# Patient Record
Sex: Male | Born: 1995 | Race: White | Hispanic: No | Marital: Single | Smoking: Never smoker
Health system: Southern US, Community
[De-identification: ages and names within clinical notes are randomized; demographics above are authoritative.]

## PROBLEM LIST (undated history)

## (undated) DIAGNOSIS — B9681 Helicobacter pylori [H. pylori] as the cause of diseases classified elsewhere: Secondary | ICD-10-CM

## (undated) DIAGNOSIS — R7881 Bacteremia: Secondary | ICD-10-CM

## (undated) DIAGNOSIS — K589 Irritable bowel syndrome without diarrhea: Secondary | ICD-10-CM

## (undated) DIAGNOSIS — K900 Celiac disease: Secondary | ICD-10-CM

## (undated) DIAGNOSIS — Z8781 Personal history of (healed) traumatic fracture: Secondary | ICD-10-CM

## (undated) HISTORY — DX: Celiac disease: K90.0

## (undated) HISTORY — DX: Irritable bowel syndrome without diarrhea: K58.9

## (undated) HISTORY — PX: NOSE SURGERY: SHX723

---
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
## 2017-01-29 ENCOUNTER — Ambulatory Visit: Payer: Medicaid Other | Admitting: Internal Medicine

## 2017-03-07 ENCOUNTER — Ambulatory Visit: Payer: Medicaid Other | Admitting: Internal Medicine

## 2017-05-03 ENCOUNTER — Telehealth: Payer: Self-pay | Admitting: *Deleted

## 2017-05-03 NOTE — Telephone Encounter (Signed)
Called and spoke to Danny Khan, mother of the patient.  I advised we had records from [name] but after 9 +  months, we no longer have them.  She said she could get them for me. I advised her to have them faxed to us , to [phone] attention Hyacinth MeekerJennifer Lemmon PA's attention. I did remind Danny Khan of the appointment Tueseday, 05-08-2017.

## 2017-05-07 ENCOUNTER — Encounter: Payer: Self-pay | Admitting: Gastroenterology

## 2017-05-08 ENCOUNTER — Ambulatory Visit: Payer: Self-pay | Admitting: Physician Assistant

## 2017-05-11 ENCOUNTER — Encounter: Payer: Self-pay | Admitting: Gastroenterology

## 2017-05-11 ENCOUNTER — Ambulatory Visit (INDEPENDENT_AMBULATORY_CARE_PROVIDER_SITE_OTHER): Payer: BLUE CROSS/BLUE SHIELD | Admitting: Gastroenterology

## 2017-05-11 ENCOUNTER — Encounter (INDEPENDENT_AMBULATORY_CARE_PROVIDER_SITE_OTHER): Payer: Self-pay

## 2017-05-11 ENCOUNTER — Other Ambulatory Visit (INDEPENDENT_AMBULATORY_CARE_PROVIDER_SITE_OTHER): Payer: BLUE CROSS/BLUE SHIELD

## 2017-05-11 VITALS — BP 120/70 | HR 78 | Ht 72.0 in | Wt 212.0 lb

## 2017-05-11 DIAGNOSIS — F411 Generalized anxiety disorder: Secondary | ICD-10-CM

## 2017-05-11 DIAGNOSIS — R1084 Generalized abdominal pain: Secondary | ICD-10-CM | POA: Diagnosis not present

## 2017-05-11 DIAGNOSIS — R197 Diarrhea, unspecified: Secondary | ICD-10-CM | POA: Diagnosis not present

## 2017-05-11 LAB — CBC WITH DIFFERENTIAL/PLATELET
BASOS PCT: 0.5 % (ref 0.0–3.0)
Basophils Absolute: 0 10*3/uL (ref 0.0–0.1)
EOS PCT: 3.4 % (ref 0.0–5.0)
Eosinophils Absolute: 0.2 10*3/uL (ref 0.0–0.7)
HEMATOCRIT: 46.8 % (ref 36.0–49.0)
Hemoglobin: 16.4 g/dL — ABNORMAL HIGH (ref 12.0–16.0)
LYMPHS PCT: 20.7 % — AB (ref 24.0–48.0)
Lymphs Abs: 1.3 10*3/uL (ref 0.7–4.0)
MCHC: 35.1 g/dL (ref 31.0–37.0)
MCV: 88.4 fl (ref 78.0–98.0)
MONOS PCT: 9.4 % (ref 3.0–12.0)
Monocytes Absolute: 0.6 10*3/uL (ref 0.1–1.0)
NEUTROS ABS: 4.3 10*3/uL (ref 1.4–7.7)
Neutrophils Relative %: 66 % (ref 43.0–71.0)
PLATELETS: 206 10*3/uL (ref 150.0–575.0)
RBC: 5.29 Mil/uL (ref 3.80–5.70)
RDW: 12.5 % (ref 11.4–15.5)
WBC: 6.5 10*3/uL (ref 4.5–13.5)

## 2017-05-11 LAB — COMPREHENSIVE METABOLIC PANEL
ALT: 16 U/L (ref 0–53)
AST: 16 U/L (ref 0–37)
Albumin: 4.7 g/dL (ref 3.5–5.2)
Alkaline Phosphatase: 100 U/L (ref 52–171)
BUN: 14 mg/dL (ref 6–23)
CHLORIDE: 105 meq/L (ref 96–112)
CO2: 28 meq/L (ref 19–32)
Calcium: 9.7 mg/dL (ref 8.4–10.5)
Creatinine, Ser: 1.08 mg/dL (ref 0.40–1.50)
GFR: 93.25 mL/min (ref >60.00)
Glucose, Bld: 94 mg/dL (ref 70–99)
Potassium: 3.9 mEq/L (ref 3.5–5.1)
Sodium: 142 mEq/L (ref 135–145)
Total Bilirubin: 0.6 mg/dL (ref 0.2–1.2)
Total Protein: 7.3 g/dL (ref 6.0–8.3)

## 2017-05-11 LAB — IGA: IgA: 220 mg/dL (ref 68–378)

## 2017-05-11 LAB — TSH: TSH: 1.14 u[IU]/mL (ref 0.40–5.00)

## 2017-05-11 LAB — SEDIMENTATION RATE: SED RATE: 4 mm/h (ref 0–15)

## 2017-05-11 LAB — C-REACTIVE PROTEIN: CRP: 0.4 mg/dL — AB (ref 0.5–20.0)

## 2017-05-11 MED ORDER — DICYCLOMINE HCL 20 MG PO TABS: 20.0000 mg | ORAL_TABLET | Freq: Two times a day (BID) | ORAL | 3 refills | Status: DC

## 2017-05-11 MED ORDER — NA SULFATE-K SULFATE-MG SULF 17.5-3.13-1.6 GM/177ML PO SOLN: 1.0000 | ORAL | 0 refills | Status: DC

## 2017-05-11 NOTE — Patient Instructions (Addendum)
You may have a light breakfast the morning of prep day (the day before the procedure).  You may choose from one of the following items: eggs and toast OR chicken noodle soup and crackers.  You should have your breakfast completed between 8:00 and 9:00 am the day before your procedure.   After you have had your light breakfast you should start a clear liquid diet only, NO SOLIDS. No additional solid food is allowed. You may continue to have clear liquid up to 3 hours prior to your procedure.   Your physician has requested that you go to the basement for lab work before leaving today.  We have sent the following medications to your pharmacy for you to pick up at your convenience: Bentyl  You have been scheduled for a colonoscopy. Please follow written instructions given to you at your visit today.  Please pick up your prep supplies at the pharmacy within the next 1-3 days. If you use inhalers (even only as needed), please bring them with you on the day of your procedure. Your physician has requested that you go to www.startemmi.com and enter the access code given to you at your visit today. This web site gives a general overview about your procedure. However, you should still follow specific instructions given to you by our office regarding your preparation for the procedure.  We have given you a lactose free diet.   Decrease your Amitriptyline to 1/2 tablet.

## 2017-05-11 NOTE — Progress Notes (Addendum)
05/11/2017 Danny Khan [id number] 05-29-1996   HISTORY OF PRESENT ILLNESS:  This is a pleasant 21 year old male who is new to our office. He is here today with his mom. Both him and his mother provide history that patient has been suffering from diarrhea and abdominal pain for several years, since he is 69 or 21 years old. They describe several bowel movements a day. They report that when he was 65 or 21 years old he spent several days of [facility] with Campylobacter infection. They were told at that time that he needs to follow up because they felt like he may have some underlying inflammatory bowel disease that caused him to become so sick from that infection, but they never followed up, mostly due to patient being stubborn and anxious. He tells me that he truly feels all of his diarrhea psychologic and related to anxiety. He says that if he is at home or driving around in his RV camper where there is a bathroom accessible at all times then he does not have diarrhea. The immediate second and he has to travel somewhere and does not have immediate access to the bathroom he begins having diarrhea. He says that he knows where every single bathroom is on on every single trip that they take and how long it takes to get from one place to another. His mother reports that they were late for their appointment today because they had to stop twice on the way down here from [place] where they live because patient had to use the bathroom. His mother says that they cannot go anywhere because of this.  Along with the diarrhea he gets abdominal pain and cramping that sometimes is very severe. He feels like he may be lactose intolerant because he says if he drinks some milk he will have diarrhea shortly after. He is currently being treated with Bentyl 20 milligrams twice daily and amitriptyline 25 mg at bedtime by his pediatrician. His mom says that he had been on amitriptyline after his hospitalization at  [facility], but eventually came off of it. She does not feel like it is helping. He thinks that psychologically he feels like it is helping almost like a placebo effect, just the fact of knowing that he is taking something for it. He obviously has high anxiety. They have discussed with his PCP about potentially treating that, but his mother wants evaluation done from a GI standpoint first.  He says that Imodium does help the diarrhea when he takes it.  Does see bright red blood the toilet paper at times that he thinks is from irritation.  Referred here by PCP, Dr. Glee Arvin.   Past Medical History:  Diagnosis Date  . Campylobacter bacteremia   . History of broken nose   . IBS (irritable bowel syndrome)    Past Surgical History:  Procedure Laterality Date  . NOSE SURGERY      reports that he has never smoked. He has never used smokeless tobacco. He reports that he does not drink alcohol or use drugs. family history includes Hypertension in his father; Lupus in his mother; Other in his brother. Allergies  Allergen Reactions  . Codeine Rash      Outpatient Encounter Prescriptions as of 05/11/2017  Medication Sig  . amitriptyline (ELAVIL) 25 MG tablet Take 25 mg by mouth at bedtime.  . dicyclomine (BENTYL) 20 MG tablet Take 20 mg by mouth 2 (two) times daily.   No facility-administered encounter medications on file  as of 05/11/2017.      REVIEW OF SYSTEMS  : All other systems reviewed and negative except where noted in the History of Present Illness.   PHYSICAL EXAM: BP 120/70   Pulse 78   Ht 6' (1.829 m)   Wt 212 lb (96.2 kg)   BMI 28.75 kg/m  General: Well developed white male in no acute distress; appears anxious and bouncing his legs throughout the visit. Head: Normocephalic and atraumatic Eyes:  Sclerae anicteric, conjunctiva pink. Ears: Normal auditory acuity Lungs: Clear throughout to auscultation; no increased WOB. Heart: Regular rate and rhythm Abdomen: Soft,  non-distended.  BS present.  Minimal diffuse TTP. Rectal:  Will be done at the time of colonoscopy. Musculoskeletal: Symmetrical with no gross deformities  Skin: No lesions on visible extremities Extremities: No edema  Neurological: Alert oriented x 4, grossly non-focal Psychological:  Alert and cooperative. Normal mood and affect  ASSESSMENT AND PLAN: -21 year old male with complaints of long-standing diarrhea and abdominal pain:  Likely has lactose intolerance by history and I am sure that he has some component of IBS as well with his high levels of anxiety, but I think that we do need to rule out IBD and celiac disease.  Will schedule for colonoscopy with Dr. Myrtie Neitheranis.  We'll check labs as well including CBC, CMP, TSH, sedimentation rate, CRP, and celiac studies.  Should follow a lactose free diet.  Can continue Bentyl 20 mg BID.  I am not sure that elavil is the best medicine for him and his mother would like him to get off of it because it does not even seem to be helping.  I am going to have him decrease his dose by half for a couple of weeks. -Anxiety:  Has extremely high anxiety, mostly related to his GI symptoms, but ? If the anxiety is causing his symptoms.  This may need to be addressed by PCP and apparently it has been discussed.  *The risks, benefits, and alternatives to colonoscopy were discussed with the patient and he consents to proceed.   CC:  Lynden OxfordSeamon, David W, MD   Thank you for sending this case to me. I have reviewed the entire note, and the outlined plan seems appropriate.   Amada JupiterHenry Danis, MD

## 2017-05-14 ENCOUNTER — Ambulatory Visit (AMBULATORY_SURGERY_CENTER): Payer: BLUE CROSS/BLUE SHIELD | Admitting: Gastroenterology

## 2017-05-14 ENCOUNTER — Encounter: Payer: Self-pay | Admitting: Gastroenterology

## 2017-05-14 VITALS — BP 110/76 | HR 95 | Temp 96.9°F | Resp 12 | Ht 72.0 in | Wt 212.0 lb

## 2017-05-14 DIAGNOSIS — K529 Noninfective gastroenteritis and colitis, unspecified: Secondary | ICD-10-CM | POA: Diagnosis not present

## 2017-05-14 DIAGNOSIS — R1084 Generalized abdominal pain: Secondary | ICD-10-CM

## 2017-05-14 LAB — TISSUE TRANSGLUTAMINASE, IGA: Tissue Transglutaminase Ab, IgA: 17 U/mL — ABNORMAL HIGH (ref <4)

## 2017-05-14 MED ORDER — HYOSCYAMINE SULFATE ER 0.375 MG PO TB12: 0.3750 mg | ORAL_TABLET | Freq: Two times a day (BID) | ORAL | 0 refills | Status: DC

## 2017-05-14 MED ORDER — SODIUM CHLORIDE 0.9 % IV SOLN: 500.0000 mL | INTRAVENOUS | Status: DC

## 2017-05-14 NOTE — Patient Instructions (Signed)
YOU HAD AN ENDOSCOPIC PROCEDURE TODAY AT THE [facility]:   Refer to the procedure report that was given to you for any specific questions about what was found during the examination.  If the procedure report does not answer your questions, please call your gastroenterologist to clarify.  If you requested that your care partner not be given the details of your procedure findings, then the procedure report has been included in a sealed envelope for you to review at your convenience later.  YOU SHOULD EXPECT: Some feelings of bloating in the abdomen. Passage of more gas than usual.  Walking can help get rid of the air that was put into your GI tract during the procedure and reduce the bloating. If you had a lower endoscopy (such as a colonoscopy or flexible sigmoidoscopy) you may notice spotting of blood in your stool or on the toilet paper. If you underwent a bowel prep for your procedure, you may not have a normal bowel movement for a few days.  Please Note:  You might notice some irritation and congestion in your nose or some drainage.  This is from the oxygen used during your procedure.  There is no need for concern and it should clear up in a day or so.  SYMPTOMS TO REPORT IMMEDIATELY:   Following lower endoscopy (colonoscopy or flexible sigmoidoscopy):  Excessive amounts of blood in the stool  Significant tenderness or worsening of abdominal pains  Swelling of the abdomen that is new, acute  Fever of 100F or higher   Following upper endoscopy (EGD)  Vomiting of blood or coffee ground material  New chest pain or pain under the shoulder blades  Painful or persistently difficult swallowing  New shortness of breath  Fever of 100F or higher  Black, tarry-looking stools  For urgent or emergent issues, a gastroenterologist can be reached at any hour by calling [phone].   DIET:  We do recommend a small meal at first, but then you may proceed to your regular diet.  Drink  plenty of fluids but you should avoid alcoholic beverages for 24 hours.  ACTIVITY:  You should plan to take it easy for the rest of today and you should NOT DRIVE or use heavy machinery until tomorrow (because of the sedation medicines used during the test).    FOLLOW UP: Our staff will call the number listed on your records the next business day following your procedure to check on you and address any questions or concerns that you may have regarding the information given to you following your procedure. If we do not reach you, we will leave a message.  However, if you are feeling well and you are not experiencing any problems, there is no need to return our call.  We will assume that you have returned to your regular daily activities without incident.  If any biopsies were taken you will be contacted by phone or by letter within the next 1-3 weeks.  Please call us at [phone] if you have not heard about the biopsies in 3 weeks.    SIGNATURES/CONFIDENTIALITY: You and/or your care partner have signed paperwork which will be entered into your electronic medical record.  These signatures attest to the fact that that the information above on your After Visit Summary has been reviewed and is understood.  Full responsibility of the confidentiality of this discharge information lies with you and/or your care-partner.  Normal colononscopy.

## 2017-05-14 NOTE — Op Note (Signed)
[facility] Patient Name: Danny Khan Procedure Date: 05/14/2017 3:12 PM MRN: [id number] Endoscopist: Sherilyn CooterHenry L. Myrtie Neitheranis , MD Age: [age] Referring MD:  Date of Birth: 08/26/1996 Gender: Male Account #: [id number] Procedure:                Colonoscopy Indications:              Generalized abdominal pain, Chronic diarrhea Medicines:                Monitored Anesthesia Care Procedure:                Pre-Anesthesia Assessment:                           - Prior to the procedure, a History and Physical                            was performed, and patient medications and                            allergies were reviewed. The patient's tolerance of                            previous anesthesia was also reviewed. The risks                            and benefits of the procedure and the sedation                            options and risks were discussed with the patient.                            All questions were answered, and informed consent                            was obtained. Prior Anticoagulants: The patient has                            taken no previous anticoagulant or antiplatelet                            agents. ASA Grade Assessment: I - A normal, healthy                            patient. After reviewing the risks and benefits,                            the patient was deemed in satisfactory condition to                            undergo the procedure.                           After obtaining informed consent, the colonoscope  was passed under direct vision. Throughout the                            procedure, the patient's blood pressure, pulse, and                            oxygen saturations were monitored continuously. The                            Colonoscope was introduced through the anus and                            advanced to the 15 cm into the ileum. The                            colonoscopy was performed without  difficulty. The                            patient tolerated the procedure well. The quality                            of the bowel preparation was excellent. The                            terminal ileum, ileocecal valve, appendiceal                            orifice, and rectum were photographed. The bowel                            preparation used was SUPREP. Scope In: 3:34:38 PM Scope Out: 3:43:43 PM Scope Withdrawal Time: 0 hours 7 minutes 37 seconds  Total Procedure Duration: 0 hours 9 minutes 5 seconds  Findings:                 The perianal and digital rectal examinations were                            normal.                           The terminal ileum appeared normal.                           The entire examined colon appeared normal on direct                            and retroflexion views. Complications:            No immediate complications. Estimated Blood Loss:     Estimated blood loss: none. Impression:               - The examined portion of the ileum was normal.                           - The entire examined colon is  normal.                           - The entire examined colon is normal on direct and                            retroflexion views.                           - No specimens collected.                           Symptoms are most consistent with IBS. Recommendation:           - Patient has a contact number available for                            emergencies. The signs and symptoms of potential                            delayed complications were discussed with the                            patient. Return to normal activities tomorrow.                            Written discharge instructions were provided to the                            patient.                           - Resume previous diet.                           - Continue present medications. If dicyclomine has                            not been helpful, an alternate medicine can  be                            tried.                           - No recommendation at this time regarding repeat                            colonoscopy due to young age.                           - Return to primary care physician to address                            chronic anxiety. Latonia Conrow L. Myrtie Neither, MD 05/14/2017 3:53:17 PM This report has been signed electronically.

## 2017-05-14 NOTE — Progress Notes (Signed)
Pt's states no medical or surgical changes since previsit or office visit. 

## 2017-05-14 NOTE — Progress Notes (Signed)
Report given to PACU, vss 

## 2017-05-15 ENCOUNTER — Telehealth: Payer: Self-pay

## 2017-05-15 ENCOUNTER — Telehealth: Payer: Self-pay | Admitting: Gastroenterology

## 2017-05-15 NOTE — Telephone Encounter (Signed)
   Follow up Call-  Call back number 05/14/2017  Post procedure Call Back phone  # [phone]  Permission to leave phone message Yes  Some recent data might be hidden     Patient questions:  Do you have a fever, pain , or abdominal swelling? No. Pain Score  0 *  Have you tolerated food without any problems? Yes.    Have you been able to return to your normal activities? Yes.    Do you have any questions about your discharge instructions: Diet   No. Medications  No. Follow up visit  No.  Do you have questions or concerns about your Care? No.  Actions: * If pain score is 4 or above: No action needed, pain <4.

## 2017-05-15 NOTE — Telephone Encounter (Signed)
Danny BumpsJessica, have you reviewed the labs for this pt.  She is calling to get results.  Thanks

## 2017-05-16 ENCOUNTER — Encounter: Payer: Self-pay | Admitting: Gastroenterology

## 2017-05-22 ENCOUNTER — Ambulatory Visit (AMBULATORY_SURGERY_CENTER): Payer: Self-pay

## 2017-05-22 VITALS — Ht 72.0 in | Wt 212.4 lb

## 2017-05-22 DIAGNOSIS — R1084 Generalized abdominal pain: Secondary | ICD-10-CM

## 2017-05-22 NOTE — Progress Notes (Signed)
Just had propofol iv last week; did well

## 2017-05-28 ENCOUNTER — Ambulatory Visit (AMBULATORY_SURGERY_CENTER): Payer: BLUE CROSS/BLUE SHIELD | Admitting: Gastroenterology

## 2017-05-28 ENCOUNTER — Encounter: Payer: Self-pay | Admitting: Gastroenterology

## 2017-05-28 VITALS — BP 109/45 | HR 78 | Temp 98.7°F | Resp 15 | Ht 72.0 in | Wt 212.0 lb

## 2017-05-28 DIAGNOSIS — R894 Abnormal immunological findings in specimens from other organs, systems and tissues: Secondary | ICD-10-CM

## 2017-05-28 DIAGNOSIS — K529 Noninfective gastroenteritis and colitis, unspecified: Secondary | ICD-10-CM

## 2017-05-28 MED ORDER — SODIUM CHLORIDE 0.9 % IV SOLN: 500.0000 mL | INTRAVENOUS | Status: DC

## 2017-05-28 NOTE — Patient Instructions (Signed)
YOU HAD AN ENDOSCOPIC PROCEDURE TODAY AT THE [facility]:   Refer to the procedure report that was given to you for any specific questions about what was found during the examination.  If the procedure report does not answer your questions, please call your gastroenterologist to clarify.  If you requested that your care partner not be given the details of your procedure findings, then the procedure report has been included in a sealed envelope for you to review at your convenience later.  YOU SHOULD EXPECT: Some feelings of bloating in the abdomen. Passage of more gas than usual.  Walking can help get rid of the air that was put into your GI tract during the procedure and reduce the bloating. If you had a lower endoscopy (such as a colonoscopy or flexible sigmoidoscopy) you may notice spotting of blood in your stool or on the toilet paper. If you underwent a bowel prep for your procedure, you may not have a normal bowel movement for a few days.  Please Note:  You might notice some irritation and congestion in your nose or some drainage.  This is from the oxygen used during your procedure.  There is no need for concern and it should clear up in a day or so.  SYMPTOMS TO REPORT IMMEDIATELY:   Following upper endoscopy (EGD)  Vomiting of blood or coffee ground material  New chest pain or pain under the shoulder blades  Painful or persistently difficult swallowing  New shortness of breath  Fever of 100F or higher  Black, tarry-looking stools  For urgent or emergent issues, a gastroenterologist can be reached at any hour by calling [phone].   DIET:  We do recommend a small meal at first, but then you may proceed to your regular diet.  Drink plenty of fluids but you should avoid alcoholic beverages for 24 hours.  ACTIVITY:  You should plan to take it easy for the rest of today and you should NOT DRIVE or use heavy machinery until tomorrow (because of the sedation medicines used  during the test).    FOLLOW UP: Our staff will call the number listed on your records the next business day following your procedure to check on you and address any questions or concerns that you may have regarding the information given to you following your procedure. If we do not reach you, we will leave a message.  However, if you are feeling well and you are not experiencing any problems, there is no need to return our call.  We will assume that you have returned to your regular daily activities without incident.  If any biopsies were taken you will be contacted by phone or by letter within the next 1-3 weeks.  Please call us at [phone] if you have not heard about the biopsies in 3 weeks.   Gluten free diet Await for biopsy results   SIGNATURES/CONFIDENTIALITY: You and/or your care partner have signed paperwork which will be entered into your electronic medical record.  These signatures attest to the fact that that the information above on your After Visit Summary has been reviewed and is understood.  Full responsibility of the confidentiality of this discharge information lies with you and/or your care-partner.

## 2017-05-28 NOTE — Progress Notes (Signed)
Called to room to assist during endoscopic procedure.  Patient ID and intended procedure confirmed with present staff. Received instructions for my participation in the procedure from the performing physician.  

## 2017-05-28 NOTE — Op Note (Signed)
[facility] Patient Name: Danny Khan Procedure Date: 05/28/2017 11:08 AM MRN: [id number] Endoscopist: Sherilyn CooterHenry L. 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 The patient's tolerance of                            previous anesthesia was also reviewed. The risks                            and benefits of the procedure and the sedation                            options and risks were discussed with the patient.                            All questions were answered, and informed consent                            was obtained. Prior Anticoagulants: The patient has                            taken no previous anticoagulant or antiplatelet                            agents. ASA Grade Assessment: II - A patient with                            mild systemic disease. After reviewing the risks                            and benefits, the patient was deemed in  satisfactory condition to undergo the procedure.                           After obtaining informed consent, the endoscope was                            passed under direct vision. Throughout the                            procedure, the patient's blood pressure, pulse, and                            oxygen saturations were monitored continuously. The                            Endoscope was introduced  through the mouth, and                            advanced to the second part of duodenum. The upper                            GI endoscopy was accomplished without difficulty.                            The patient tolerated the procedure well. Scope In: Scope Out: Findings:                 The esophagus was normal.                           The stomach was normal.                           The cardia and gastric fundus were normal on                            retroflexion.                           Scalloped mucosa was found in the first and second                            portion of the duodenum; thickened folds were found                            in the first and second portion of the duodenum.                            Eight biopsies were taken with a cold forceps for                            histology. Complications:            No immediate complications. Estimated Blood Loss:     Estimated blood loss: none. Impression:               -  Normal esophagus.                           - Normal stomach.                           - Duodenal mucosal changes seen, suspicious for                            celiac disease. Biopsied. Recommendation:           - Patient has a contact number available for                            emergencies. The signs and symptoms of potential                            delayed complications were discussed with the                            patient. Return to normal activities tomorrow.                            Written discharge instructions were provided to the                            patient.                           - Gluten free diet.                           - Continue present medications.                           - Await pathology results. Crystle Carelli L. Myrtie Neither, MD 05/28/2017 11:38:46 AM This report has been signed electronically.

## 2017-05-28 NOTE — Progress Notes (Signed)
To PACU, vss patent aw report to rn 

## 2017-05-29 ENCOUNTER — Telehealth: Payer: Self-pay

## 2017-05-29 NOTE — Telephone Encounter (Signed)
  Follow up Call-  Call back number 05/28/2017 05/14/2017  Post procedure Call Back phone  # [phone]  Permission to leave phone message Yes Yes  Some recent data might be hidden     Patient questions:  Do you have a fever, pain , or abdominal swelling? No. Pain Score  0 *  Have you tolerated food without any problems? Yes.    Have you been able to return to your normal activities? Yes.    Do you have any questions about your discharge instructions: Diet   No. Medications  No. Follow up visit  No.  Do you have questions or concerns about your Care? No.  Actions: * If pain score is 4 or above: No action needed, pain <4.

## 2017-05-31 ENCOUNTER — Telehealth: Payer: Self-pay

## 2017-05-31 NOTE — Telephone Encounter (Signed)
The pt has been advised of the results and recommendations and will follow up with Dr Myrtie Neitheranis in 2 months

## 2017-05-31 NOTE — Telephone Encounter (Signed)
-----   Message from Leta BaptistJessica D Zehr, PA-C sent at 05/31/2017  1:41 PM EDT ----- Please let the patient or his mother know that biopsies were positive for celiac, as suspected.  He should already be on a gluten-free diet and absolutely needs to continue that.  Dr. Myrtie Neitheranis would like him to have a follow-up in about 2 months.  Thank you,  Jess

## 2017-06-08 ENCOUNTER — Telehealth: Payer: Self-pay | Admitting: Gastroenterology

## 2017-06-08 NOTE — Telephone Encounter (Signed)
Spoke to patient's mother, Danny Khan, she states that they are trying to follow a strict gluten free diet. Since coming off of medication  patient is having an increase in abdominal cramping and pain, especially in the morning until around noon. Increase in diarrhea, is taking imodium for this. She is asking to have him put back on his previous medications or perhaps something different. She is also asking for help from a nutritionist.

## 2017-06-08 NOTE — Telephone Encounter (Signed)
Left message that I am returning her call. 

## 2017-06-08 NOTE — Telephone Encounter (Signed)
Pt's mom returning phone call best call back # is [phone]

## 2017-06-11 ENCOUNTER — Other Ambulatory Visit: Payer: Self-pay

## 2017-06-11 DIAGNOSIS — K900 Celiac disease: Secondary | ICD-10-CM

## 2017-06-11 NOTE — Telephone Encounter (Signed)
Spoke to Sprint Nextel CorporationKim, let her know that Dr. Myrtie Neitheranis did not want Danny Khan to come off of the dicyclomine and needs to continue it, 20 mg TID, asked her to contact us if he needs refills. He is not taking anything other than imodium. She was asking about the amitriptyline, let her know that this would need to be further discussed with his primary physician. She was confused/frustrated about this, since it is being given to him for anxiety related diarrhea, wondering why she needs to take him to the primary care physician. She is also frustrated as she thought that they would be having a follow up visit after colonoscopy, to discuss moving forward

## 2017-06-11 NOTE — Telephone Encounter (Signed)
Addendum: I have offered to make a follow up appointment for Danny Khan, also let her know that they can expect a call from the Nutritionist at [facility]. She will check with [name] to see if he wants to make a follow up visit, if so will call back to schedule.

## 2017-06-11 NOTE — Telephone Encounter (Signed)
Left message for patient's mother, Selena BattenKim. I did call and check with [facility], they do provide nutritional guidance for celiac sprue. Referral placed and they will contact patient's mother to schedule. Need to talk further with Selena BattenKim about patient's medications.

## 2017-06-11 NOTE — Telephone Encounter (Signed)
I am not sure which medication we are talking about.  If it is the amitriptyline that his primary provider gave him for anxiety, then it must be addressed with them.  I believe he has a significant amount of anxiety-related diarrhea on top of the celiac sprue.     If we are talking about the dicyclomine, then I was under the impression he was still taking it.  If not, then he should be on 20 mg three times daily.  Please find out if the [facility] dietician does consults for gluten intolerance.  If so, please send referral.  If not, please ask them for advice on where we can find one in the area.

## 2017-07-10 ENCOUNTER — Ambulatory Visit: Payer: BLUE CROSS/BLUE SHIELD | Admitting: Gastroenterology

## 2017-07-13 ENCOUNTER — Ambulatory Visit: Payer: BLUE CROSS/BLUE SHIELD | Admitting: Dietician

## 2017-08-06 ENCOUNTER — Ambulatory Visit: Payer: BLUE CROSS/BLUE SHIELD | Admitting: Gastroenterology

## 2017-08-06 ENCOUNTER — Encounter: Payer: Self-pay | Admitting: Gastroenterology

## 2017-08-06 ENCOUNTER — Ambulatory Visit (INDEPENDENT_AMBULATORY_CARE_PROVIDER_SITE_OTHER): Payer: BLUE CROSS/BLUE SHIELD | Admitting: Gastroenterology

## 2017-08-06 ENCOUNTER — Telehealth: Payer: Self-pay | Admitting: Nutrition

## 2017-08-06 VITALS — BP 104/70 | HR 72 | Ht 72.0 in | Wt 201.2 lb

## 2017-08-06 DIAGNOSIS — K580 Irritable bowel syndrome with diarrhea: Secondary | ICD-10-CM | POA: Diagnosis not present

## 2017-08-06 DIAGNOSIS — R634 Abnormal weight loss: Secondary | ICD-10-CM

## 2017-08-06 DIAGNOSIS — K900 Celiac disease: Secondary | ICD-10-CM

## 2017-08-06 DIAGNOSIS — F419 Anxiety disorder, unspecified: Secondary | ICD-10-CM | POA: Diagnosis not present

## 2017-08-06 MED ORDER — ELUXADOLINE 75 MG PO TABS: 75.0000 mg | ORAL_TABLET | ORAL | 0 refills | Status: AC

## 2017-08-06 NOTE — Telephone Encounter (Signed)
Pts mother confirmed appt

## 2017-08-06 NOTE — Patient Instructions (Addendum)
VIBERZI:  One a day Samples of this drug were given to the patient, quantity 24, Lot Number [id number]  Please follow up on 09-13-2017 @ 4pm  Thank you for choosing [facility]  Dr Amada Jupiter III

## 2017-08-06 NOTE — Progress Notes (Signed)
     [facility] Progress Note  Chief Complaint: Celiac sprue  Subjective  History:  Danny Khan follows up along with his mother today for his celiac sprue and IBS. He reports being adherent to a gluten-free diet, and he is seeing a nutritionist tomorrow.  He was referred a couple of months ago, but was unable to do so due to scheduling conflicts. As before, he still has multiple soft to loose stools in the first few hours awaking every morning. He has to make multiple stops use the bathroom going anywhere. It has been like this for years,, and Imodium does not seem to help much. He needs many tablets, and then adjust makes him feel bloated. He also apparently had no improvement on dicyclomine, which his PCP had given him intermittently for years. He eventually weaned off amitriptyline, which had been given to him for anxiety. His mother states that his pediatrician has turned him over to our care to manage his IBS and diarrhea, and he does not currently have an adult primary care provider. He has lost 11 pounds since he last saw [place] 2 months ago.   ROS: Cardiovascular:  no chest pain Respiratory: no dyspnea He has social anxiety about going anywhere because of his diarrhea.  The patient's Past Medical, Family and Social History were reviewed and are on file in the EMR.  Objective:  Med list reviewed  Current Outpatient Prescriptions:  .  loperamide (IMODIUM) 2 MG capsule, Take by mouth 4 (four) times daily., Disp: , Rfl:  .  dicyclomine (BENTYL) 20 MG tablet, TAKE 1 TAB BY MOUTH TWICE A DAY, Disp: , Rfl: 0 .  Eluxadoline (VIBERZI) 75 MG TABS, Take 75 mg by mouth 1 day or 1 dose., Disp: 24 tablet, Rfl: 0   Vital signs in last 24 hrs: Vitals:   08/06/17 0817  BP: 104/70  Pulse: 72    Physical Exam    HEENT: sclera anicteric, oral mucosa moist without lesions. No muscle wasting  Neck: supple, no thyromegaly, JVD or lymphadenopathy  Cardiac: RRR without murmurs, S1S2 heard,  no peripheral edema  Pulm: clear to auscultation bilaterally, normal RR and effort noted  Abdomen: soft, no tenderness, with active bowel sounds. No guarding or palpable hepatosplenomegaly.  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 TTG antibody elevated at 17   @ Assessment: Encounter Diagnoses  Name Primary?  . Celiac sprue Yes  . Irritable bowel syndrome with diarrhea   . Chronic anxiety   . Abnormal loss of weight    While Danny Khan has biopsy proven celiac sprue, I think he has severe IBS as well. We again had a discussion about how this is exacerbated by his chronic underlying anxiety.   Plan: See nutritionist as scheduled A trial of Viberzi 75 mg once daily. 2 weeks of samples were given, he will then call us with an update. I strongly recommended a new primary care provider to discuss medicines for his anxiety. His mother plans to see if she can get him in to their family practice.  Follow-up with me in 4-5 weeks,: Sooner as needed.   Total time 30 minutes, over half spent in counseling and coordination of care.   Danny Khan III

## 2017-08-07 ENCOUNTER — Encounter: Payer: Self-pay | Admitting: Dietician

## 2017-08-07 ENCOUNTER — Encounter: Payer: BLUE CROSS/BLUE SHIELD | Attending: Gastroenterology | Admitting: Dietician

## 2017-08-07 DIAGNOSIS — Z68.41 Body mass index (BMI) pediatric, 85th percentile to less than 95th percentile for age: Secondary | ICD-10-CM | POA: Insufficient documentation

## 2017-08-07 DIAGNOSIS — Z713 Dietary counseling and surveillance: Secondary | ICD-10-CM | POA: Diagnosis not present

## 2017-08-07 DIAGNOSIS — K900 Celiac disease: Secondary | ICD-10-CM | POA: Insufficient documentation

## 2017-08-07 NOTE — Progress Notes (Signed)
  Medical Nutrition Therapy:  Appt start time: 1645 end time:  1745.  Assessment:  Primary concerns today: Patient is here today with his girlfriend.  He wants to learn more about things to avoid with celiac including how to manage eating out.  He reports that he has been following a gluten free diet for a month but due to learning it may not have been strict.  He gave up dip (tobacco) about 3-4 months ago when he had a precancerous spot on the inside of his lip. He does Vape. He struggled with IBS symptoms for years prior to the celiac diagnosis.   Weight yesterday 201 lbs decreased 22 lbs in the past month since celiac diagnosis and giving up wheat.  He continue to have diarrhea and abdominal discomfort.    Patient lives with his girlfriend. They share shopping and cooking.  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  He also drag races.  MEDICATIONS: see list  DIETARY INTAKE: Dislikes vegetables and fruit.  Tolerates onions.  Likes [employer].   24-hr recall:  B ( AM): skips  Snk ( AM) :none  L ( PM): McDonald's 2 hamberger patties and french fries OR other fast food.  Snk (PM): none D (PM): Gluten Free lasagne OR chicken, potatoes or rice OR tacos  Snk ( PM): occasional brownies and cookies that are gluten free Beverages: sweet tea, water, gatorade, sprite, Dr. Reino Kent   Recent physical activity: work related   Estimated energy needs: 3000 calories 120 g protein  Progress Towards Goal(s):  In progress.   Nutritional Diagnosis:  NB-1.1 Food and nutrition-related knowledge deficit As related to celiac disease.  As evidenced by patient report.    Intervention:  Nutrition education related to celiac disease.  Discussed what foods have gluten and to avoid and what foods are allowed and gluten free.  Discussed cross contamination.  Discussed eating out.  Handouts given during visit include:  Celiac Nutrition therapy from AND  Celiac  Label reading from AND  Celiac tips from AND  Monitoring/Evaluation:  Dietary intake, exercise, label reading, and body weight prn.

## 2017-08-07 NOTE — Patient Instructions (Addendum)
Avoid wheat, rye, barley. Avoid oats if recommended by MD and choose Gluten Free Oats when you are allowed. When eating out, ask the chef how the food is prepared or for a gluten free menu. Look for areas of cross contamination (toasters, fryers, grills).   Encouraged him to eat breakfast or a protein shake after he gets to work. Encouraged him to continue to try new foods as he is feeling better.  Great job on quitting dip!!

## 2017-08-30 ENCOUNTER — Telehealth: Payer: Self-pay | Admitting: Gastroenterology

## 2017-08-30 DIAGNOSIS — Z79899 Other long term (current) drug therapy: Secondary | ICD-10-CM

## 2017-08-30 DIAGNOSIS — Z789 Other specified health status: Secondary | ICD-10-CM

## 2017-08-30 NOTE — Telephone Encounter (Signed)
Viberzi 75 mg one a day refill request. Seen on 08-06-2017.

## 2017-08-30 NOTE — Telephone Encounter (Signed)
Please ask if he was taking it once or twice daily.  Then send a prescription for a month supply with 4 refills.

## 2017-09-03 MED ORDER — ELUXADOLINE 75 MG PO TABS: 1.0000 | ORAL_TABLET | Freq: Every day | ORAL | 3 refills | Status: AC

## 2017-09-03 NOTE — Telephone Encounter (Signed)
done

## 2017-09-03 NOTE — Telephone Encounter (Signed)
Once a day only

## 2017-09-13 ENCOUNTER — Ambulatory Visit: Payer: BLUE CROSS/BLUE SHIELD | Admitting: Gastroenterology

## 2020-06-25 ENCOUNTER — Emergency Department (HOSPITAL_COMMUNITY)
Admission: EM | Admit: 2020-06-25 | Discharge: 2020-06-25 | Discharge status: Against Medical Advice | Payer: Medicaid Other | Attending: Emergency Medicine | Admitting: Emergency Medicine

## 2020-06-25 ENCOUNTER — Other Ambulatory Visit: Payer: Self-pay

## 2020-06-25 ENCOUNTER — Emergency Department (HOSPITAL_COMMUNITY): Payer: Medicaid Other

## 2020-06-25 ENCOUNTER — Encounter (HOSPITAL_COMMUNITY): Payer: Self-pay

## 2020-06-25 DIAGNOSIS — R1084 Generalized abdominal pain: Secondary | ICD-10-CM | POA: Insufficient documentation

## 2020-06-25 DIAGNOSIS — R112 Nausea with vomiting, unspecified: Secondary | ICD-10-CM | POA: Diagnosis not present

## 2020-06-25 DIAGNOSIS — R0789 Other chest pain: Secondary | ICD-10-CM | POA: Diagnosis not present

## 2020-06-25 DIAGNOSIS — T675XXA Heat exhaustion, unspecified, initial encounter: Secondary | ICD-10-CM | POA: Diagnosis not present

## 2020-06-25 DIAGNOSIS — T679XXA Effect of heat and light, unspecified, initial encounter: Secondary | ICD-10-CM

## 2020-06-25 DIAGNOSIS — Z20822 Contact with and (suspected) exposure to covid-19: Secondary | ICD-10-CM | POA: Insufficient documentation

## 2020-06-25 LAB — COMPREHENSIVE METABOLIC PANEL
ALT: 18 U/L (ref 0–44)
AST: 22 U/L (ref 15–41)
Albumin: 4.3 g/dL (ref 3.5–5.0)
Alkaline Phosphatase: 89 U/L (ref 38–126)
Anion gap: 13 (ref 5–15)
BUN: 20 mg/dL (ref 6–20)
CO2: 19 mmol/L — ABNORMAL LOW (ref 22–32)
Calcium: 8.3 mg/dL — ABNORMAL LOW (ref 8.9–10.3)
Chloride: 109 mmol/L (ref 98–111)
Creatinine, Ser: 1.22 mg/dL (ref 0.61–1.24)
GFR calc Af Amer: >60 mL/min (ref >60)
GFR calc non Af Amer: >60 mL/min (ref >60)
Glucose, Bld: 108 mg/dL — ABNORMAL HIGH (ref 70–99)
Potassium: 3.4 mmol/L — ABNORMAL LOW (ref 3.5–5.1)
Sodium: 141 mmol/L (ref 135–145)
Total Bilirubin: 0.9 mg/dL (ref 0.3–1.2)
Total Protein: 7 g/dL (ref 6.5–8.1)

## 2020-06-25 LAB — CBC WITH DIFFERENTIAL/PLATELET
Abs Immature Granulocytes: 0.06 10*3/uL (ref 0.00–0.07)
Basophils Absolute: 0 10*3/uL (ref 0.0–0.1)
Basophils Relative: 0 %
Eosinophils Absolute: 0.1 10*3/uL (ref 0.0–0.5)
Eosinophils Relative: 1 %
HCT: 44.9 % (ref 39.0–52.0)
Hemoglobin: 16 g/dL (ref 13.0–17.0)
Immature Granulocytes: 0 %
Lymphocytes Relative: 4 %
Lymphs Abs: 0.6 10*3/uL — ABNORMAL LOW (ref 0.7–4.0)
MCH: 31.7 pg (ref 26.0–34.0)
MCHC: 35.6 g/dL (ref 30.0–36.0)
MCV: 89.1 fL (ref 80.0–100.0)
Monocytes Absolute: 1.3 10*3/uL — ABNORMAL HIGH (ref 0.1–1.0)
Monocytes Relative: 7 %
Neutro Abs: 15.6 10*3/uL — ABNORMAL HIGH (ref 1.7–7.7)
Neutrophils Relative %: 88 %
Platelets: 191 10*3/uL (ref 150–400)
RBC: 5.04 MIL/uL (ref 4.22–5.81)
RDW: 12 % (ref 11.5–15.5)
WBC: 17.6 10*3/uL — ABNORMAL HIGH (ref 4.0–10.5)
nRBC: 0 % (ref 0.0–0.2)

## 2020-06-25 LAB — LIPASE, BLOOD: Lipase: 24 U/L (ref 11–51)

## 2020-06-25 LAB — TROPONIN I (HIGH SENSITIVITY): Troponin I (High Sensitivity): <2 ng/L (ref <18)

## 2020-06-25 LAB — CK: Total CK: 185 U/L (ref 49–397)

## 2020-06-25 LAB — SARS CORONAVIRUS 2 BY RT PCR (HOSPITAL ORDER, PERFORMED IN ~~LOC~~ HOSPITAL LAB): SARS Coronavirus 2: NEGATIVE

## 2020-06-25 MED ORDER — LACTATED RINGERS IV BOLUS
1000.0000 mL | Freq: Once | INTRAVENOUS | Status: AC
  Administered 2020-06-25: 1000 mL via INTRAVENOUS

## 2020-06-25 MED ORDER — HALOPERIDOL LACTATE 5 MG/ML IJ SOLN
2.0000 mg | Freq: Once | INTRAMUSCULAR | Status: DC
  Filled 2020-06-25: qty 1

## 2020-06-25 NOTE — ED Notes (Signed)
Pt unable to finish orthostatic due to sensation of passing out.

## 2020-06-25 NOTE — ED Triage Notes (Signed)
Patient out [place] hot today [place] race track since 12pm, poor po intake, began to feel nauseated, experienced projectile vomiting  Per [facility], tachy in the 140's and dizzy.

## 2020-06-25 NOTE — ED Notes (Signed)
Patient stated he did not want anymore test or meds and requested to leave at this time, disconnected from monitor, IV removed, signed AMA, Ambulatory gait upon leaving

## 2020-06-25 NOTE — ED Provider Notes (Signed)
[facility]-EMERGENCY DEPT Provider Note   CSN: [id number] Arrival date & time: 06/25/20  0003     History Chief Complaint  Patient presents with  . Heat Exposure  . Nausea    Danny Khan is a 24 y.o. male with a past medical history of celiac disease who presents today for evaluation.  He reports that he was [place] today [place] since about noon.  He states that he has drank a fair amount of water and sports drinks throughout the day however notes that he has had dark urine today.  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  He states that after he had one the race he started feeling nauseous, lightheaded and vomited.  He developed abdominal pain and chest pain around the same time he started feeling nauseous.  He vomited 3 times.  No diarrhea.  Zofran was given by EMS.  He was reportedly tacky in the 140s with EMS.  They gave 800 mL of fluid prior to arrival. Patient reports that he is not vaccinated against Covid.  He has never had similar symptoms before.    HPI     Past Medical History:  Diagnosis Date  . Campylobacter bacteremia   . Celiac disease   . History of broken nose   . IBS (irritable bowel syndrome)     Patient Active Problem List   Diagnosis Date Noted  . Generalized abdominal pain 05/11/2017  . Diarrhea 05/11/2017  . Anxiety state 05/11/2017    Past Surgical History:  Procedure Laterality Date  . NOSE SURGERY         Family History  Problem Relation Age of Onset  . Lupus Mother   . Hypertension Father   . Other Brother        HSP  . Colon cancer Neg Hx   . Rectal cancer Neg Hx   . Esophageal cancer Neg Hx     Social History   Tobacco Use  . Smoking status: Never Smoker  . Smokeless tobacco: Former Neurosurgeon    Types: Engineer, drilling  . Vaping Use: Never used  Substance Use Topics  . Alcohol use: No  . Drug use: No    Home Medications Prior to Admission medications   Medication Sig Start Date End Date  Taking? Authorizing Provider  dicyclomine (BENTYL) 20 MG tablet TAKE 1 TAB BY MOUTH TWICE A DAY 04/10/17   [provider]  Eluxadoline (VIBERZI) 75 MG TABS Take 1 tablet by mouth daily. 09/03/17   Sherrilyn Rist, MD  loperamide (IMODIUM) 2 MG capsule Take by mouth 4 (four) times daily.    [provider]    Allergies    Gluten meal and Codeine  Review of Systems   Review of Systems  Constitutional: Negative for chills and fever.  HENT: Negative for congestion.   Respiratory: Positive for chest tightness and shortness of breath.   Cardiovascular: Positive for chest pain. Negative for palpitations and leg swelling.  Gastrointestinal: Positive for abdominal pain, nausea and vomiting. Negative for constipation and diarrhea.  Musculoskeletal: Negative for back pain and neck pain.  Neurological: Positive for light-headedness. Negative for weakness.  Psychiatric/Behavioral: Negative for confusion.    Physical Exam Updated Vital Signs BP 127/88   Pulse (!) 101   Temp 98.8 F (37.1 C) (Oral)   Resp 13   SpO2 98%   Physical Exam Vitals and nursing note reviewed.  Constitutional:  Appearance: He is well-developed. He is not ill-appearing.  HENT:     Head: Normocephalic and atraumatic.  Eyes:     Conjunctiva/sclera: Conjunctivae normal.  Cardiovascular:     Rate and Rhythm: Normal rate and regular rhythm.     Pulses: Normal pulses.     Heart sounds: Normal heart sounds. No murmur heard.   Pulmonary:     Effort: Pulmonary effort is normal. No respiratory distress.     Breath sounds: Normal breath sounds.  Abdominal:     General: There is no distension.     Palpations: Abdomen is soft.     Tenderness: There is abdominal tenderness (Diffuse discomfort). There is no guarding or rebound.  Musculoskeletal:     Cervical back: Normal range of motion and neck supple. No rigidity.     Right lower leg: No edema.     Left lower leg: No edema.  Skin:     General: Skin is warm and dry.  Neurological:     General: No focal deficit present.     Mental Status: He is alert and oriented to person, place, and time.  Psychiatric:        Mood and Affect: Mood normal.        Behavior: Behavior normal.     ED Results / Procedures / Treatments   Labs (all labs ordered are listed, but only abnormal results are displayed) Labs Reviewed  COMPREHENSIVE METABOLIC PANEL - Abnormal; Notable for the following components:      Result Value   Potassium 3.4 (*)    CO2 19 (*)    Glucose, Bld 108 (*)    Calcium 8.3 (*)    All other components within normal limits  CBC WITH DIFFERENTIAL/PLATELET - Abnormal; Notable for the following components:   WBC 17.6 (*)    Neutro Abs 15.6 (*)    Lymphs Abs 0.6 (*)    Monocytes Absolute 1.3 (*)    All other components within normal limits  SARS CORONAVIRUS 2 BY RT PCR (HOSPITAL ORDER, PERFORMED IN [facility])  LIPASE, BLOOD  CK  URINALYSIS, COMPLETE (UACMP) WITH MICROSCOPIC  TROPONIN I (HIGH SENSITIVITY)  TROPONIN I (HIGH SENSITIVITY)    EKG EKG Interpretation  Date/Time:  Friday June 25 2020 00:56:26 EDT Ventricular Rate:  98 PR Interval:    QRS Duration: 83 QT Interval:  349 QTC Calculation: 446 R Axis:   81 Text Interpretation: Sinus rhythm Normal ECG No old tracing to compare Confirmed by Devoria Albe ([id number]) on 06/25/2020 1:09:38 AM   Radiology DG Chest Portable 1 View  Result Date: 06/25/2020 CLINICAL DATA:  Patient was overheated today and developed nausea and dizziness. Now with chest pain EXAM: PORTABLE CHEST 1 VIEW COMPARISON:  None. FINDINGS: Normal heart size with mild prominence of the left pulmonary outflow tract. No vascular congestion, edema, or consolidation. No pleural effusions. No pneumothorax. Mediastinal contours appear intact. IMPRESSION: No active disease. Electronically Signed   By: Burman Nieves M.D.   On: 06/25/2020 00:45    Procedures Procedures (including  critical care time)  Medications Ordered in ED Medications  lactated ringers bolus 1,000 mL (0 mLs Intravenous Stopped 06/25/20 0344)    ED Course  I have reviewed the triage vital signs and the nursing notes.  Pertinent labs & imaging results that were available during my care of the patient were reviewed by me and considered in my medical decision making (see chart for details).  Clinical Course as of Jun 25 2129  Fri Jun 25, 2020  8657 Patient vomited again.  Initially considered haldol, however patient states he doesn't want more medication at this time. He does not want to stay for a delta troponin.  He will PO challenge on fluids.  He wishes to lave.  Will sign AMA.    [EH]    Clinical Course User Index [EH] Norman Clay   MDM Rules/Calculators/A&P                         Patient is a 24 year old man who presents today for evaluation of possible heat exposure.  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  He was given Zofran by EMS.  On my exam he is borderline tachycardic.  Plan to obtain basic labs, EKG, UA, CK, and rehydrate patient.  Labs obtained and reviewed, CK is not significantly elevated.  CBC shows leukocytosis, this may be reactive.  CMP shows mild hypokalemia and lightly low CO2 at 19.  Chest x-ray was obtained without evidence of acute abnormality.  IV fluids were ordered.  He was given Zofran prior to arrival.  He was slightly orthostatic on the recorded orthostatics.  Recommended delta troponin, additional hydration and p.o. challenging.  Patient refused this stating that he wanted to leave.  He did vomit right before he stated that he wanted to leave.  We discussed that if he goes home and is unable to hydrate that is a unsafe situation, he states his understanding and still wishes to leave at this time.  Patient signed out AMA.     Danny Khan was evaluated in Emergency Department on 06/25/2020 for the symptoms described in the history of present illness. He was evaluated in the context of the global COVID-19 pandemic, which necessitated consideration that the patient might be at risk for infection with the SARS-CoV-2 virus that causes COVID-19. Institutional protocols and algorithms that pertain to the evaluation of patients at risk for COVID-19 are in a state of rapid change based on information released by regulatory bodies including the CDC and federal and state organizations. These policies and algorithms were followed during the patient's care in the ED.  Note: Portions of this report may have been transcribed using voice recognition software. Every effort was made to ensure accuracy; however, inadvertent computerized transcription errors may be present    Final Clinical Impression(s) / ED Diagnoses Final diagnoses:  Heat exposure, initial encounter  Nausea and vomiting, intractability of vomiting not specified, unspecified vomiting type    Rx / DC Orders ED Discharge Orders    None       Cristina Gong, PA-C 06/25/20 [id number]    Devoria Albe, MD 06/25/20 2303

## 2021-10-08 IMAGING — DX DG CHEST 1V PORT
1 series · 2 of 2 positions shown · non-contrast
Comparison: None.

CLINICAL DATA: Patient was overheated today and developed nausea
and dizziness. Now with chest pain

EXAM:
PORTABLE CHEST 1 VIEW

[Series 1: chest ap · 0.14mm/px · 2 of 2 slices shown]
[im 1/2]
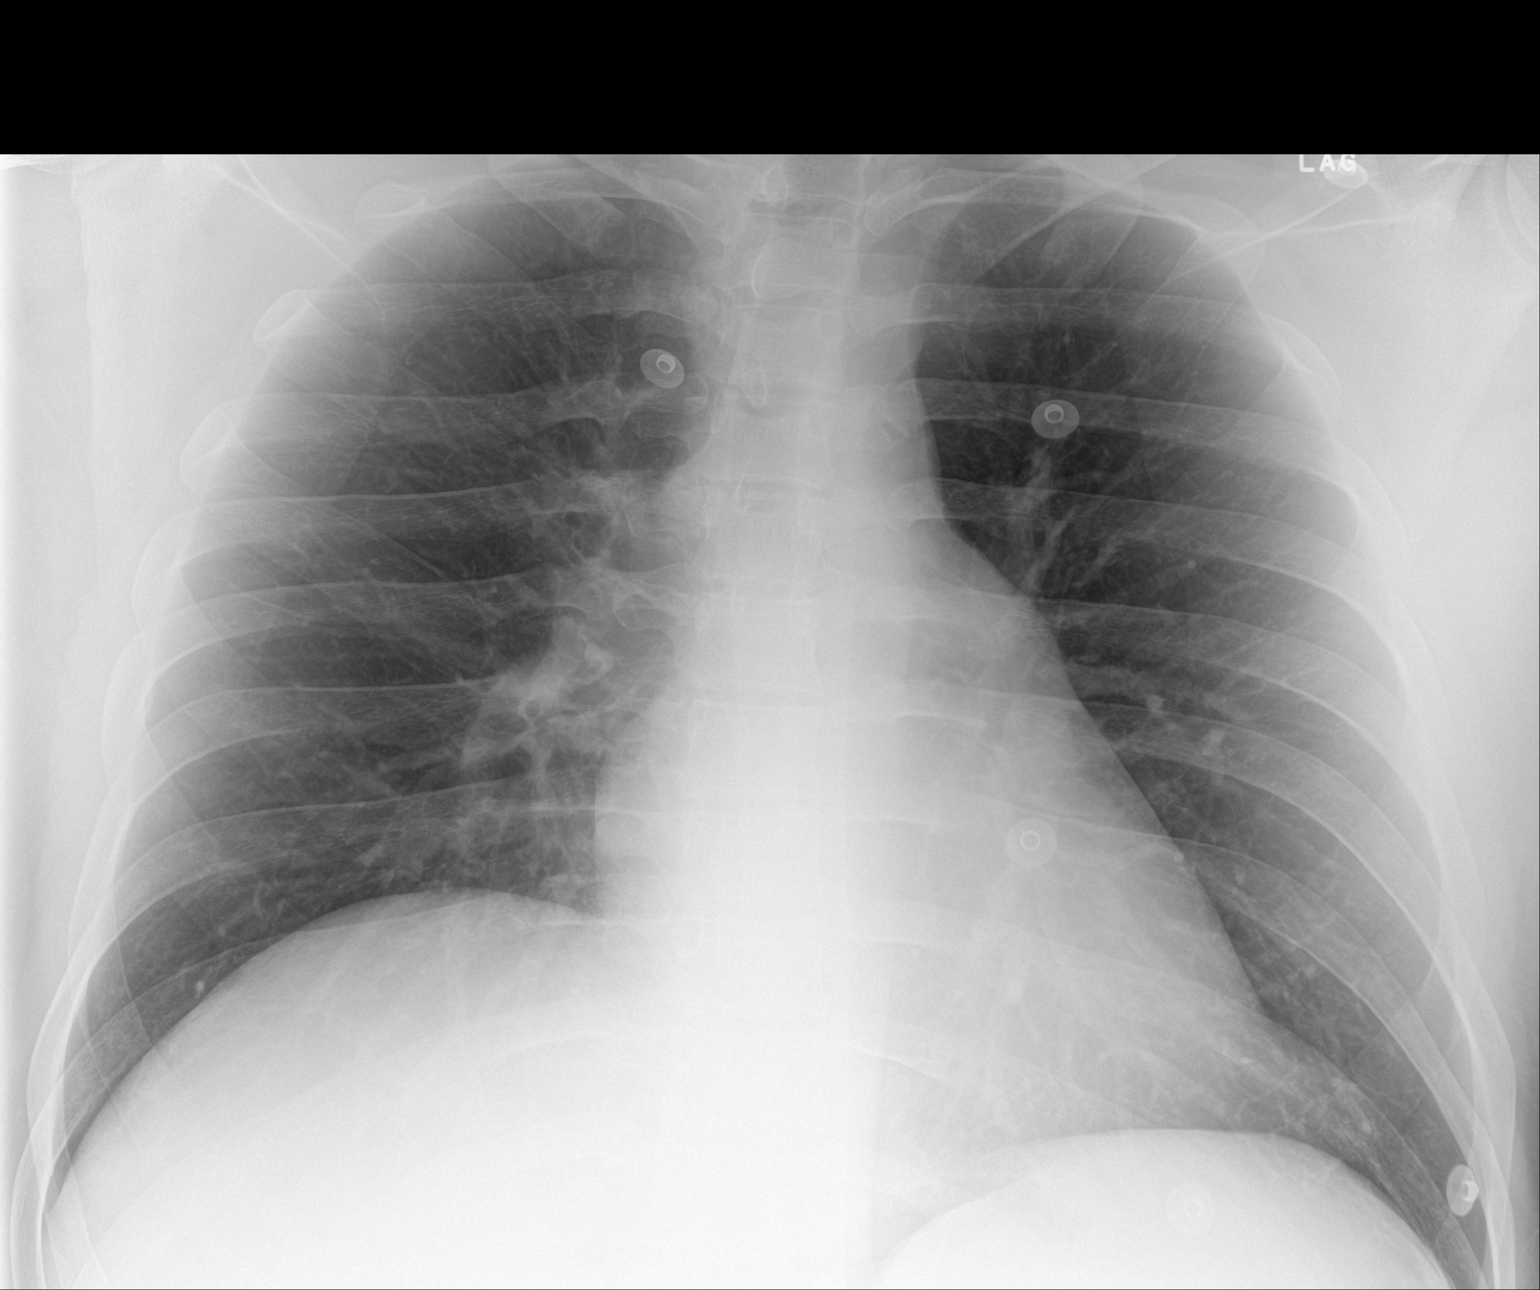
[im 2/2]
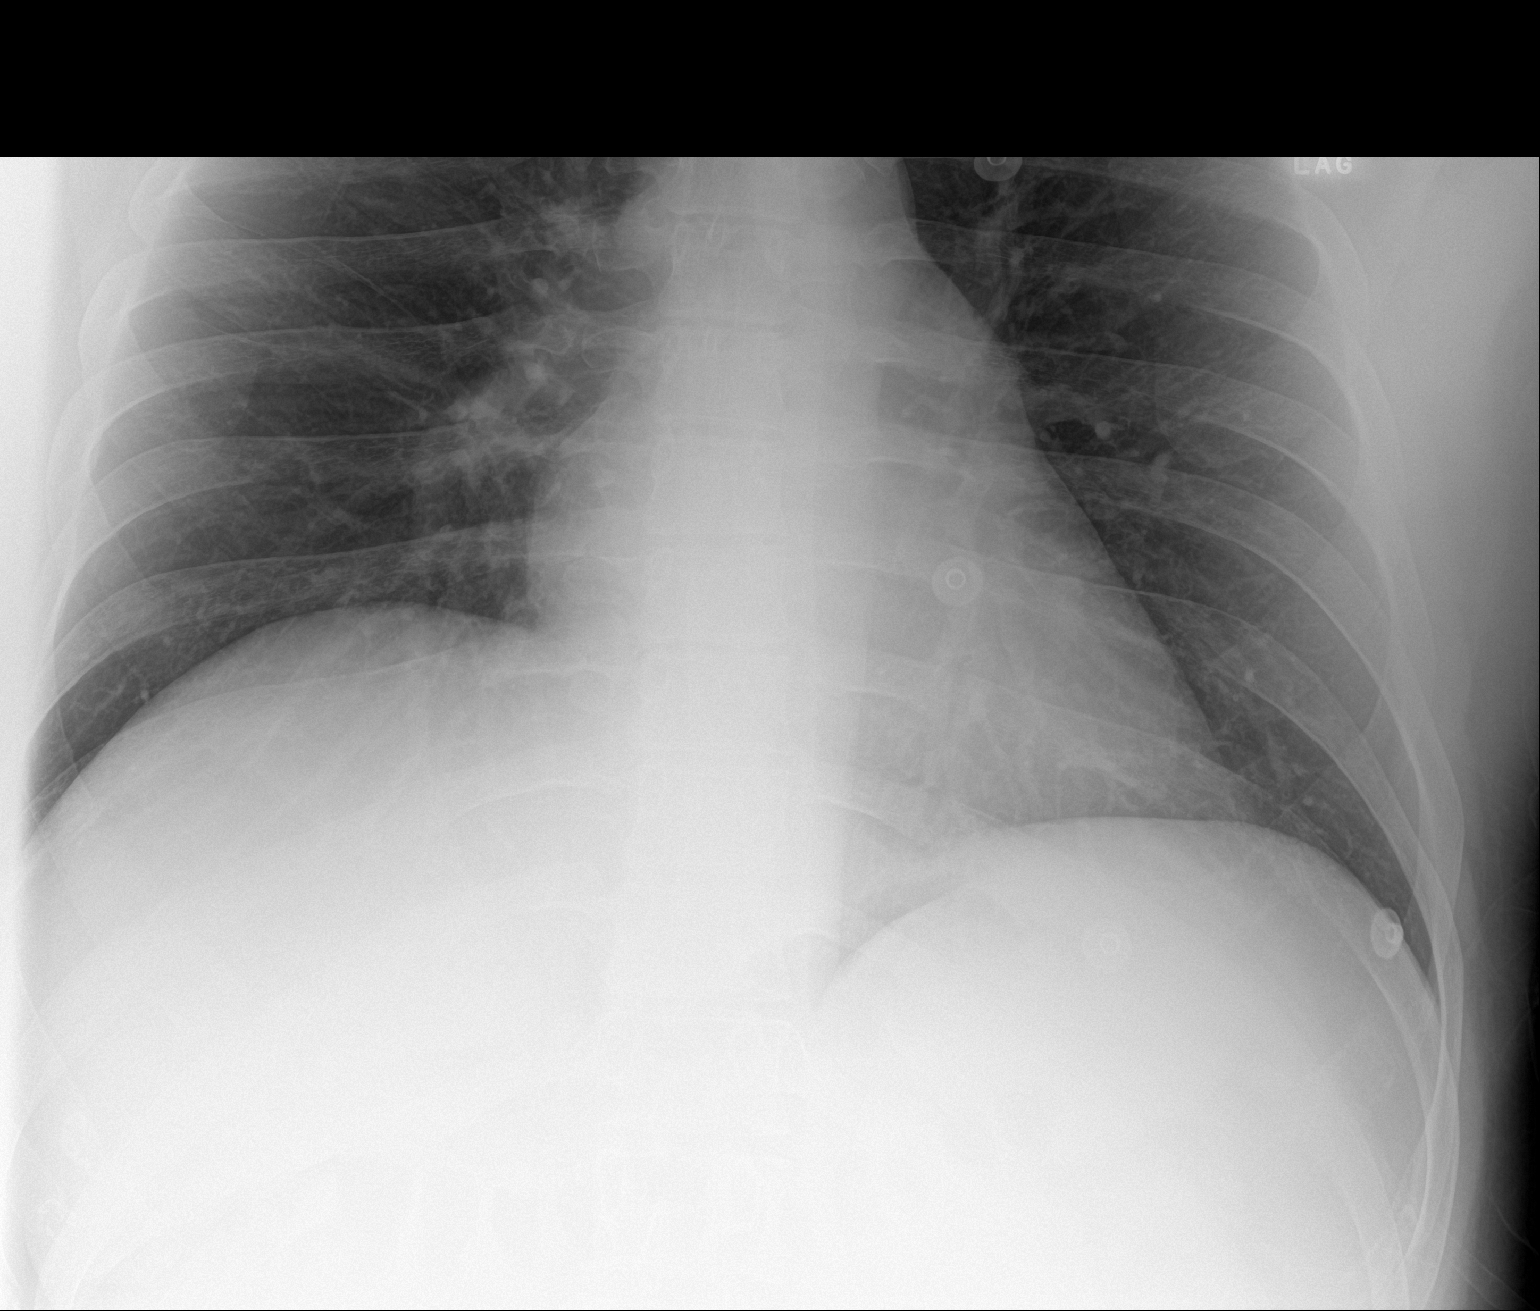

[2 of 2 positions shown; findings below may reference images not displayed]

FINDINGS: Normal heart size with mild prominence of the left pulmonary outflow
tract. No vascular congestion, edema, or consolidation. No pleural
effusions. No pneumothorax. Mediastinal contours appear intact.
IMPRESSION: No active disease.
